# Patient Record
Sex: Female | Born: 1960 | Race: White | Hispanic: Yes | Marital: Married | Smoking: Never smoker
Health system: Southern US, Community
[De-identification: ages and names within clinical notes are randomized; demographics above are authoritative.]

## PROBLEM LIST (undated history)

## (undated) DIAGNOSIS — R002 Palpitations: Secondary | ICD-10-CM

## (undated) DIAGNOSIS — R202 Paresthesia of skin: Secondary | ICD-10-CM

## (undated) DIAGNOSIS — R7989 Other specified abnormal findings of blood chemistry: Secondary | ICD-10-CM

## (undated) HISTORY — DX: Other specified abnormal findings of blood chemistry: R79.89

## (undated) HISTORY — DX: Palpitations: R00.2

## (undated) HISTORY — DX: Paresthesia of skin: R20.2

---

## 2007-08-25 ENCOUNTER — Other Ambulatory Visit: Admission: RE | Admit: 2007-08-25 | Discharge: 2007-08-25 | Payer: Self-pay | Admitting: Obstetrics and Gynecology

## 2007-09-29 ENCOUNTER — Ambulatory Visit (HOSPITAL_COMMUNITY): Admission: RE | Admit: 2007-09-29 | Discharge: 2007-09-29 | Payer: Self-pay | Admitting: Obstetrics and Gynecology

## 2008-09-03 ENCOUNTER — Other Ambulatory Visit: Admission: RE | Admit: 2008-09-03 | Discharge: 2008-09-03 | Payer: Self-pay | Admitting: Obstetrics and Gynecology

## 2008-10-06 ENCOUNTER — Ambulatory Visit (HOSPITAL_COMMUNITY): Admission: RE | Admit: 2008-10-06 | Discharge: 2008-10-06 | Payer: Self-pay | Admitting: Obstetrics and Gynecology

## 2009-10-21 ENCOUNTER — Emergency Department (HOSPITAL_COMMUNITY)
Admission: EM | Admit: 2009-10-21 | Discharge: 2009-10-21 | Payer: Self-pay | Source: Home / Self Care | Admitting: Emergency Medicine

## 2009-10-25 ENCOUNTER — Encounter: Admission: RE | Admit: 2009-10-25 | Discharge: 2009-10-25 | Payer: Self-pay | Admitting: Family Medicine

## 2009-11-04 ENCOUNTER — Encounter: Admission: RE | Admit: 2009-11-04 | Discharge: 2009-11-04 | Payer: Self-pay | Admitting: Family Medicine

## 2010-04-10 ENCOUNTER — Encounter: Payer: Self-pay | Admitting: Obstetrics and Gynecology

## 2010-07-06 ENCOUNTER — Other Ambulatory Visit: Payer: Self-pay | Admitting: Neurosurgery

## 2010-07-06 DIAGNOSIS — M545 Low back pain: Secondary | ICD-10-CM

## 2010-07-07 ENCOUNTER — Ambulatory Visit
Admission: RE | Admit: 2010-07-07 | Discharge: 2010-07-07 | Disposition: A | Discharge status: Home / Self Care | Payer: Self-pay | Source: Ambulatory Visit | Attending: Neurosurgery | Admitting: Neurosurgery

## 2010-07-07 DIAGNOSIS — M545 Low back pain: Secondary | ICD-10-CM

## 2010-07-11 ENCOUNTER — Other Ambulatory Visit: Payer: Self-pay

## 2010-10-30 ENCOUNTER — Other Ambulatory Visit: Payer: Self-pay | Admitting: Family Medicine

## 2010-10-30 DIAGNOSIS — Z1231 Encounter for screening mammogram for malignant neoplasm of breast: Secondary | ICD-10-CM

## 2010-10-30 DIAGNOSIS — Z139 Encounter for screening, unspecified: Secondary | ICD-10-CM

## 2010-10-31 ENCOUNTER — Ambulatory Visit
Admission: RE | Admit: 2010-10-31 | Discharge: 2010-10-31 | Disposition: A | Discharge status: Home / Self Care | Payer: BC Managed Care – PPO | Source: Ambulatory Visit | Attending: Family Medicine | Admitting: Family Medicine

## 2010-10-31 ENCOUNTER — Other Ambulatory Visit: Payer: Self-pay | Admitting: Family Medicine

## 2010-10-31 DIAGNOSIS — Z139 Encounter for screening, unspecified: Secondary | ICD-10-CM

## 2010-10-31 DIAGNOSIS — Z1231 Encounter for screening mammogram for malignant neoplasm of breast: Secondary | ICD-10-CM
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
## 2011-11-06 ENCOUNTER — Other Ambulatory Visit: Payer: Self-pay | Admitting: Family Medicine

## 2011-11-06 DIAGNOSIS — Z1231 Encounter for screening mammogram for malignant neoplasm of breast: Secondary | ICD-10-CM

## 2011-11-18 LAB — HM MAMMOGRAPHY

## 2011-11-23 ENCOUNTER — Other Ambulatory Visit (HOSPITAL_COMMUNITY): Payer: Self-pay | Admitting: Obstetrics and Gynecology

## 2011-11-23 DIAGNOSIS — Z1231 Encounter for screening mammogram for malignant neoplasm of breast: Secondary | ICD-10-CM

## 2011-11-30 ENCOUNTER — Ambulatory Visit (HOSPITAL_COMMUNITY)
Admission: RE | Admit: 2011-11-30 | Discharge: 2011-11-30 | Disposition: A | Discharge status: Home / Self Care | Payer: BC Managed Care – PPO | Source: Ambulatory Visit | Attending: Obstetrics and Gynecology | Admitting: Obstetrics and Gynecology

## 2011-11-30 DIAGNOSIS — Z1231 Encounter for screening mammogram for malignant neoplasm of breast: Secondary | ICD-10-CM | POA: Insufficient documentation

## 2012-10-10 ENCOUNTER — Ambulatory Visit: Payer: Self-pay | Admitting: Obstetrics and Gynecology

## 2012-10-10 DIAGNOSIS — Z01419 Encounter for gynecological examination (general) (routine) without abnormal findings: Secondary | ICD-10-CM

## 2012-10-13 ENCOUNTER — Telehealth: Payer: Self-pay | Admitting: Obstetrics and Gynecology

## 2012-10-13 ENCOUNTER — Ambulatory Visit (INDEPENDENT_AMBULATORY_CARE_PROVIDER_SITE_OTHER): Payer: BC Managed Care – PPO | Admitting: Obstetrics and Gynecology

## 2012-10-13 ENCOUNTER — Encounter: Payer: Self-pay | Admitting: Obstetrics and Gynecology

## 2012-10-13 VITALS — BP 100/66 | HR 60 | Ht 62.0 in | Wt 97.5 lb

## 2012-10-13 DIAGNOSIS — Z0000 Encounter for general adult medical examination without abnormal findings: Secondary | ICD-10-CM

## 2012-10-13 DIAGNOSIS — Z01419 Encounter for gynecological examination (general) (routine) without abnormal findings: Secondary | ICD-10-CM

## 2012-10-13 DIAGNOSIS — R5381 Other malaise: Secondary | ICD-10-CM

## 2012-10-13 DIAGNOSIS — Z1211 Encounter for screening for malignant neoplasm of colon: Secondary | ICD-10-CM

## 2012-10-13 DIAGNOSIS — R5383 Other fatigue: Secondary | ICD-10-CM

## 2012-10-13 LAB — POCT URINALYSIS DIPSTICK
Blood, UA: NEGATIVE
Nitrite, UA: NEGATIVE
pH, UA: 5

## 2012-10-13 NOTE — Patient Instructions (Signed)
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
## 2012-10-13 NOTE — Telephone Encounter (Signed)
Patient missed her appointment on Friday . Called this morning to reschedule for this afternoon at 2:30pm.

## 2012-10-14 ENCOUNTER — Other Ambulatory Visit: Payer: BC Managed Care – PPO

## 2012-10-15 ENCOUNTER — Other Ambulatory Visit (INDEPENDENT_AMBULATORY_CARE_PROVIDER_SITE_OTHER): Payer: BC Managed Care – PPO

## 2012-10-15 DIAGNOSIS — Z01419 Encounter for gynecological examination (general) (routine) without abnormal findings: Secondary | ICD-10-CM

## 2012-10-15 DIAGNOSIS — Z0000 Encounter for general adult medical examination without abnormal findings: Secondary | ICD-10-CM

## 2012-10-15 DIAGNOSIS — R5381 Other malaise: Secondary | ICD-10-CM

## 2012-10-15 LAB — COMPREHENSIVE METABOLIC PANEL
Alkaline Phosphatase: 63 U/L (ref 39–117)
BUN: 10 mg/dL (ref 6–23)
CO2: 28 mEq/L (ref 19–32)
Glucose, Bld: 87 mg/dL (ref 70–99)
Total Bilirubin: 0.6 mg/dL (ref 0.3–1.2)
Total Protein: 6.9 g/dL (ref 6.0–8.3)

## 2012-10-15 LAB — CBC
HCT: 40.6 % (ref 36.0–46.0)
Hemoglobin: 13.7 g/dL (ref 12.0–15.0)
MCH: 29.2 pg (ref 26.0–34.0)
MCHC: 33.7 g/dL (ref 30.0–36.0)
MCV: 86.6 fL (ref 78.0–100.0)
Platelets: 264 10*3/uL (ref 150–400)
RBC: 4.69 MIL/uL (ref 3.87–5.11)
RDW: 13.6 % (ref 11.5–15.5)
WBC: 6.9 10*3/uL (ref 4.0–10.5)

## 2012-10-15 LAB — LIPID PANEL
HDL: 80 mg/dL (ref >39)
Triglycerides: 59 mg/dL (ref <150)

## 2012-10-15 LAB — IPS PAP TEST WITH HPV

## 2012-10-17 ENCOUNTER — Telehealth: Payer: Self-pay

## 2012-10-17 NOTE — Telephone Encounter (Signed)
LMOVM to call for test results. 

## 2012-10-17 NOTE — Telephone Encounter (Signed)
Message copied by Alphonsa Overall on Fri Oct 17, 2012 10:47 AM ------      Message from: Conley Simmonds      Created: Thu Oct 16, 2012  1:13 PM       Please report results to the patient.       Her total LDL cholesterol is slightly high, but her HDL is great!      Overall her ratios look good.            The remaining testing is all normal. ------

## 2012-10-23 NOTE — Telephone Encounter (Signed)
Patient notified of normal labs and lipid panel reviewed with patient.

## 2012-10-27 ENCOUNTER — Telehealth: Payer: Self-pay | Admitting: Orthopedic Surgery

## 2012-10-27 NOTE — Telephone Encounter (Signed)
Call to home/work number [phone]. No answer. Cannot leave message.   (Need to tell pt about appt with Dr. Loreta Ave 11-13-12 at 9 am for consult for colonoscopy. Phone [phone]. Address [address].)

## 2012-12-02 ENCOUNTER — Other Ambulatory Visit (HOSPITAL_COMMUNITY): Payer: Self-pay | Admitting: Family Medicine

## 2012-12-02 DIAGNOSIS — Z1231 Encounter for screening mammogram for malignant neoplasm of breast: Secondary | ICD-10-CM

## 2012-12-08 ENCOUNTER — Ambulatory Visit (HOSPITAL_COMMUNITY)
Admission: RE | Admit: 2012-12-08 | Discharge: 2012-12-08 | Disposition: A | Discharge status: Home / Self Care | Payer: BC Managed Care – PPO | Source: Ambulatory Visit | Attending: Family Medicine | Admitting: Family Medicine

## 2012-12-08 DIAGNOSIS — Z1231 Encounter for screening mammogram for malignant neoplasm of breast: Secondary | ICD-10-CM | POA: Insufficient documentation

## 2013-01-22 ENCOUNTER — Other Ambulatory Visit: Payer: Self-pay

## 2013-08-17 ENCOUNTER — Encounter: Payer: Self-pay | Admitting: Obstetrics and Gynecology

## 2013-10-15 ENCOUNTER — Ambulatory Visit: Payer: BC Managed Care – PPO | Admitting: Obstetrics and Gynecology

## 2013-10-19 ENCOUNTER — Encounter: Payer: Self-pay | Admitting: Obstetrics and Gynecology

## 2013-10-19 ENCOUNTER — Ambulatory Visit (INDEPENDENT_AMBULATORY_CARE_PROVIDER_SITE_OTHER): Payer: BC Managed Care – PPO | Admitting: Obstetrics and Gynecology

## 2013-10-19 VITALS — BP 98/70 | HR 66 | Resp 22 | Ht 62.0 in | Wt 96.4 lb

## 2013-10-19 DIAGNOSIS — Z0000 Encounter for general adult medical examination without abnormal findings: Secondary | ICD-10-CM

## 2013-10-19 DIAGNOSIS — Z01419 Encounter for gynecological examination (general) (routine) without abnormal findings: Secondary | ICD-10-CM

## 2013-10-19 LAB — POCT URINALYSIS DIPSTICK
BILIRUBIN UA: NEGATIVE
GLUCOSE UA: NEGATIVE
KETONES UA: NEGATIVE
LEUKOCYTES UA: NEGATIVE
Nitrite, UA: NEGATIVE
Protein, UA: NEGATIVE
RBC UA: NEGATIVE
Urobilinogen, UA: NEGATIVE
pH, UA: 5
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
## 2013-10-19 NOTE — Patient Instructions (Signed)

## 2013-10-21 ENCOUNTER — Other Ambulatory Visit (INDEPENDENT_AMBULATORY_CARE_PROVIDER_SITE_OTHER): Payer: BC Managed Care – PPO

## 2013-10-21 DIAGNOSIS — Z01419 Encounter for gynecological examination (general) (routine) without abnormal findings: Secondary | ICD-10-CM

## 2013-10-21 LAB — CBC
HEMATOCRIT: 37.8 % (ref 36.0–46.0)
Hemoglobin: 12.9 g/dL (ref 12.0–15.0)
MCH: 28.9 pg (ref 26.0–34.0)
MCHC: 34.1 g/dL (ref 30.0–36.0)
MCV: 84.8 fL (ref 78.0–100.0)
PLATELETS: 255 10*3/uL (ref 150–400)
RBC: 4.46 MIL/uL (ref 3.87–5.11)
RDW: 14.1 % (ref 11.5–15.5)
WBC: 6.6 10*3/uL (ref 4.0–10.5)

## 2013-10-22 LAB — COMPREHENSIVE METABOLIC PANEL
ALBUMIN: 4.3 g/dL (ref 3.5–5.2)
ALK PHOS: 68 U/L (ref 39–117)
ALT: 14 U/L (ref 0–35)
AST: 20 U/L (ref 0–37)
BUN: 12 mg/dL (ref 6–23)
CO2: 27 mEq/L (ref 19–32)
CREATININE: 0.75 mg/dL (ref 0.50–1.10)
Calcium: 9.7 mg/dL (ref 8.4–10.5)
Chloride: 106 mEq/L (ref 96–112)
Glucose, Bld: 86 mg/dL (ref 70–99)
POTASSIUM: 4.4 meq/L (ref 3.5–5.3)
Sodium: 140 mEq/L (ref 135–145)
Total Bilirubin: 0.5 mg/dL (ref 0.2–1.2)
Total Protein: 6.6 g/dL (ref 6.0–8.3)

## 2013-10-22 LAB — LIPID PANEL
CHOL/HDL RATIO: 2.7 ratio
Cholesterol: 208 mg/dL — ABNORMAL HIGH (ref 0–200)
HDL: 76 mg/dL (ref >39)
LDL Cholesterol: 121 mg/dL — ABNORMAL HIGH (ref 0–99)
Triglycerides: 55 mg/dL (ref <150)
VLDL: 11 mg/dL (ref 0–40)

## 2013-10-22 LAB — VITAMIN D 25 HYDROXY (VIT D DEFICIENCY, FRACTURES): VIT D 25 HYDROXY: 33 ng/mL (ref 30–89)

## 2013-12-15 ENCOUNTER — Other Ambulatory Visit (HOSPITAL_COMMUNITY): Payer: Self-pay | Admitting: Family Medicine

## 2013-12-15 ENCOUNTER — Other Ambulatory Visit: Payer: Self-pay

## 2013-12-15 DIAGNOSIS — Z1231 Encounter for screening mammogram for malignant neoplasm of breast: Secondary | ICD-10-CM

## 2013-12-16 ENCOUNTER — Ambulatory Visit (HOSPITAL_COMMUNITY)
Admission: RE | Admit: 2013-12-16 | Discharge: 2013-12-16 | Disposition: A | Discharge status: Home / Self Care | Payer: BC Managed Care – PPO | Source: Ambulatory Visit | Attending: Family Medicine | Admitting: Family Medicine

## 2013-12-16 DIAGNOSIS — Z1231 Encounter for screening mammogram for malignant neoplasm of breast: Secondary | ICD-10-CM | POA: Insufficient documentation

## 2014-01-18 ENCOUNTER — Encounter: Payer: Self-pay | Admitting: Obstetrics and Gynecology

## 2014-10-22 ENCOUNTER — Encounter: Payer: Self-pay | Admitting: Obstetrics and Gynecology

## 2014-10-22 ENCOUNTER — Ambulatory Visit (INDEPENDENT_AMBULATORY_CARE_PROVIDER_SITE_OTHER): Payer: BC Managed Care – PPO | Admitting: Obstetrics and Gynecology

## 2014-10-22 VITALS — BP 102/70 | HR 68 | Resp 16 | Ht 62.0 in | Wt 96.0 lb

## 2014-10-22 DIAGNOSIS — Z0000 Encounter for general adult medical examination without abnormal findings: Secondary | ICD-10-CM | POA: Diagnosis not present

## 2014-10-22 DIAGNOSIS — Z01419 Encounter for gynecological examination (general) (routine) without abnormal findings: Secondary | ICD-10-CM

## 2014-10-22 DIAGNOSIS — Z1211 Encounter for screening for malignant neoplasm of colon: Secondary | ICD-10-CM

## 2014-10-22 LAB — POCT URINALYSIS DIPSTICK
BILIRUBIN UA: NEGATIVE
Blood, UA: NEGATIVE
GLUCOSE UA: NEGATIVE
KETONES UA: NEGATIVE
Leukocytes, UA: NEGATIVE
Nitrite, UA: NEGATIVE
PH UA: 6
Protein, UA: NEGATIVE
Urobilinogen, UA: NEGATIVE

## 2014-10-22 NOTE — Patient Instructions (Signed)
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
## 2014-10-25 ENCOUNTER — Other Ambulatory Visit: Payer: BC Managed Care – PPO

## 2014-10-26 ENCOUNTER — Other Ambulatory Visit (INDEPENDENT_AMBULATORY_CARE_PROVIDER_SITE_OTHER): Payer: BC Managed Care – PPO

## 2014-10-26 DIAGNOSIS — Z01419 Encounter for gynecological examination (general) (routine) without abnormal findings: Secondary | ICD-10-CM

## 2014-10-26 LAB — CBC
HEMATOCRIT: 39.7 % (ref 36.0–46.0)
Hemoglobin: 13.4 g/dL (ref 12.0–15.0)
MCH: 29.3 pg (ref 26.0–34.0)
MCHC: 33.8 g/dL (ref 30.0–36.0)
MCV: 86.9 fL (ref 78.0–100.0)
MPV: 11 fL (ref 8.6–12.4)
Platelets: 225 10*3/uL (ref 150–400)
RBC: 4.57 MIL/uL (ref 3.87–5.11)
RDW: 13.5 % (ref 11.5–15.5)
WBC: 6.9 10*3/uL (ref 4.0–10.5)

## 2014-10-26 LAB — TSH: TSH: 2.847 u[IU]/mL (ref 0.350–4.500)

## 2014-10-26 LAB — COMPREHENSIVE METABOLIC PANEL
ALT: 8 U/L (ref 6–29)
AST: 18 U/L (ref 10–35)
Albumin: 4.1 g/dL (ref 3.6–5.1)
Alkaline Phosphatase: 53 U/L (ref 33–130)
BUN: 12 mg/dL (ref 7–25)
CO2: 29 mmol/L (ref 20–31)
CREATININE: 0.68 mg/dL (ref 0.50–1.05)
Calcium: 9.4 mg/dL (ref 8.6–10.4)
Chloride: 105 mmol/L (ref 98–110)
Glucose, Bld: 88 mg/dL (ref 65–99)
Potassium: 5 mmol/L (ref 3.5–5.3)
SODIUM: 140 mmol/L (ref 135–146)
Total Bilirubin: 0.4 mg/dL (ref 0.2–1.2)
Total Protein: 6.5 g/dL (ref 6.1–8.1)

## 2014-10-26 LAB — LIPID PANEL
Cholesterol: 215 mg/dL — ABNORMAL HIGH (ref 125–200)
HDL: 84 mg/dL (ref >=46)
LDL CALC: 116 mg/dL (ref <130)
Total CHOL/HDL Ratio: 2.6 Ratio (ref <=5.0)
Triglycerides: 74 mg/dL (ref <150)
VLDL: 15 mg/dL (ref <30)

## 2014-10-27 ENCOUNTER — Other Ambulatory Visit: Payer: BC Managed Care – PPO

## 2014-11-23 ENCOUNTER — Other Ambulatory Visit: Payer: Self-pay

## 2014-11-23 DIAGNOSIS — Z1231 Encounter for screening mammogram for malignant neoplasm of breast: Secondary | ICD-10-CM

## 2014-12-21 ENCOUNTER — Ambulatory Visit: Payer: BC Managed Care – PPO

## 2014-12-23 ENCOUNTER — Ambulatory Visit
Admission: RE | Admit: 2014-12-23 | Discharge: 2014-12-23 | Disposition: A | Discharge status: Home / Self Care | Payer: BC Managed Care – PPO | Source: Ambulatory Visit

## 2014-12-23 DIAGNOSIS — Z1231 Encounter for screening mammogram for malignant neoplasm of breast: Secondary | ICD-10-CM

## 2015-06-06 ENCOUNTER — Ambulatory Visit (INDEPENDENT_AMBULATORY_CARE_PROVIDER_SITE_OTHER): Payer: BC Managed Care – PPO | Admitting: Neurology

## 2015-06-06 ENCOUNTER — Encounter: Payer: Self-pay | Admitting: Neurology

## 2015-06-06 ENCOUNTER — Encounter: Payer: Self-pay | Admitting: *Deleted

## 2015-06-06 ENCOUNTER — Other Ambulatory Visit (INDEPENDENT_AMBULATORY_CARE_PROVIDER_SITE_OTHER): Payer: BC Managed Care – PPO

## 2015-06-06 VITALS — BP 110/70 | HR 59 | Ht 62.0 in | Wt 101.5 lb

## 2015-06-06 DIAGNOSIS — M546 Pain in thoracic spine: Secondary | ICD-10-CM | POA: Diagnosis not present

## 2015-06-06 DIAGNOSIS — R202 Paresthesia of skin: Secondary | ICD-10-CM

## 2015-06-06 LAB — TSH: TSH: 2.5 u[IU]/mL (ref 0.35–4.50)

## 2015-06-06 LAB — VITAMIN B12: Vitamin B-12: 753 pg/mL (ref 211–911)

## 2015-06-06 MED ORDER — GABAPENTIN 100 MG PO CAPS: 100.0000 mg | ORAL_CAPSULE | Freq: Every day | ORAL | Status: DC

## 2015-06-06 NOTE — Patient Instructions (Addendum)
1.  Start gabapentin 100mg  at bedtime, if no improvement increase to 2 tablets at bedtime in two weeks 2.  Check blood work 3.  Call with update in 1 month

## 2015-06-06 NOTE — Progress Notes (Signed)
Note routed
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
## 2015-10-24 ENCOUNTER — Other Ambulatory Visit: Payer: Self-pay | Admitting: Obstetrics and Gynecology

## 2015-10-24 DIAGNOSIS — Z1231 Encounter for screening mammogram for malignant neoplasm of breast: Secondary | ICD-10-CM

## 2015-10-27 ENCOUNTER — Ambulatory Visit: Payer: BC Managed Care – PPO | Admitting: Obstetrics and Gynecology

## 2015-10-31 ENCOUNTER — Ambulatory Visit (INDEPENDENT_AMBULATORY_CARE_PROVIDER_SITE_OTHER): Payer: BC Managed Care – PPO | Admitting: Neurology

## 2015-10-31 ENCOUNTER — Encounter: Payer: Self-pay | Admitting: Neurology

## 2015-10-31 ENCOUNTER — Telehealth: Payer: Self-pay | Admitting: Neurology

## 2015-10-31 DIAGNOSIS — R208 Other disturbances of skin sensation: Secondary | ICD-10-CM

## 2015-10-31 DIAGNOSIS — M5432 Sciatica, left side: Secondary | ICD-10-CM | POA: Diagnosis not present

## 2015-10-31 DIAGNOSIS — G5702 Lesion of sciatic nerve, left lower limb: Secondary | ICD-10-CM

## 2015-10-31 DIAGNOSIS — M5412 Radiculopathy, cervical region: Secondary | ICD-10-CM | POA: Diagnosis not present

## 2015-10-31 DIAGNOSIS — M549 Dorsalgia, unspecified: Secondary | ICD-10-CM | POA: Diagnosis not present

## 2015-10-31 DIAGNOSIS — R200 Anesthesia of skin: Secondary | ICD-10-CM | POA: Insufficient documentation

## 2015-10-31 MED ORDER — ETODOLAC 400 MG PO TABS: 400.0000 mg | ORAL_TABLET | Freq: Two times a day (BID) | ORAL | 3 refills | Status: DC

## 2015-10-31 MED ORDER — CYCLOBENZAPRINE HCL 5 MG PO TABS: ORAL_TABLET | ORAL | 3 refills | Status: DC

## 2015-10-31 NOTE — Patient Instructions (Signed)
The pain in the buttock is likely coming from piriformis muscle spasms. Look on [email or web address] to fine people doing piriformis stretch exercises.
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
## 2015-10-31 NOTE — Telephone Encounter (Signed)
Noted/fim 

## 2015-10-31 NOTE — Telephone Encounter (Signed)
FYI-Pt never stopped to check-out and I noticed there is a 6 week f/u.  Called and LVM for pt to schedule.

## 2015-11-02 ENCOUNTER — Ambulatory Visit (INDEPENDENT_AMBULATORY_CARE_PROVIDER_SITE_OTHER): Payer: BC Managed Care – PPO | Admitting: Obstetrics and Gynecology

## 2015-11-02 ENCOUNTER — Telehealth: Payer: Self-pay | Admitting: Neurology

## 2015-11-02 ENCOUNTER — Encounter: Payer: Self-pay | Admitting: Obstetrics and Gynecology

## 2015-11-02 VITALS — BP 110/60 | HR 64 | Resp 22 | Ht 62.0 in | Wt 96.2 lb

## 2015-11-02 DIAGNOSIS — R1032 Left lower quadrant pain: Secondary | ICD-10-CM

## 2015-11-02 DIAGNOSIS — Z119 Encounter for screening for infectious and parasitic diseases, unspecified: Secondary | ICD-10-CM

## 2015-11-02 DIAGNOSIS — Z01419 Encounter for gynecological examination (general) (routine) without abnormal findings: Secondary | ICD-10-CM

## 2015-11-02 DIAGNOSIS — Z0000 Encounter for general adult medical examination without abnormal findings: Secondary | ICD-10-CM

## 2015-11-02 LAB — POCT URINALYSIS DIPSTICK
Bilirubin, UA: NEGATIVE
Blood, UA: NEGATIVE
GLUCOSE UA: NEGATIVE
Ketones, UA: NEGATIVE
LEUKOCYTES UA: NEGATIVE
NITRITE UA: NEGATIVE
PROTEIN UA: NEGATIVE
UROBILINOGEN UA: NEGATIVE
pH, UA: 5
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
## 2015-11-02 NOTE — Telephone Encounter (Signed)
Pt called request MRi to be scheduled soon as she is in severe pain.  Pt also request Faith to call about getting the MRI scheduled soon

## 2015-11-02 NOTE — Telephone Encounter (Signed)
LMOM that once insurance has approved MRI, will get it sched. asap.  Some of this will depend on the imaging facility's schedule/fim

## 2015-11-02 NOTE — Patient Instructions (Signed)

## 2015-11-03 ENCOUNTER — Ambulatory Visit (INDEPENDENT_AMBULATORY_CARE_PROVIDER_SITE_OTHER): Payer: BC Managed Care – PPO | Admitting: Obstetrics and Gynecology

## 2015-11-03 ENCOUNTER — Encounter: Payer: Self-pay | Admitting: Obstetrics and Gynecology

## 2015-11-03 ENCOUNTER — Ambulatory Visit (INDEPENDENT_AMBULATORY_CARE_PROVIDER_SITE_OTHER): Payer: BC Managed Care – PPO

## 2015-11-03 VITALS — BP 108/70 | HR 60 | Ht 62.0 in | Wt 96.0 lb

## 2015-11-03 DIAGNOSIS — D251 Intramural leiomyoma of uterus: Secondary | ICD-10-CM | POA: Diagnosis not present

## 2015-11-03 DIAGNOSIS — R1032 Left lower quadrant pain: Secondary | ICD-10-CM

## 2015-11-03 DIAGNOSIS — Z0000 Encounter for general adult medical examination without abnormal findings: Secondary | ICD-10-CM

## 2015-11-03 DIAGNOSIS — Z119 Encounter for screening for infectious and parasitic diseases, unspecified: Secondary | ICD-10-CM

## 2015-11-03 LAB — CBC
HEMATOCRIT: 42.2 % (ref 35.0–45.0)
Hemoglobin: 13.8 g/dL (ref 11.7–15.5)
MCH: 29.2 pg (ref 27.0–33.0)
MCHC: 32.7 g/dL (ref 32.0–36.0)
MCV: 89.4 fL (ref 80.0–100.0)
MPV: 10.6 fL (ref 7.5–12.5)
PLATELETS: 250 10*3/uL (ref 140–400)
RBC: 4.72 MIL/uL (ref 3.80–5.10)
RDW: 13.7 % (ref 11.0–15.0)
WBC: 7.1 10*3/uL (ref 3.8–10.8)

## 2015-11-03 LAB — LIPID PANEL
Cholesterol: 231 mg/dL — ABNORMAL HIGH (ref 125–200)
HDL: 92 mg/dL (ref >=46)
LDL CALC: 127 mg/dL (ref <130)
Total CHOL/HDL Ratio: 2.5 Ratio (ref <=5.0)
Triglycerides: 58 mg/dL (ref <150)
VLDL: 12 mg/dL (ref <30)

## 2015-11-03 LAB — COMPREHENSIVE METABOLIC PANEL
ALK PHOS: 59 U/L (ref 33–130)
ALT: 12 U/L (ref 6–29)
AST: 20 U/L (ref 10–35)
Albumin: 4.4 g/dL (ref 3.6–5.1)
BUN: 15 mg/dL (ref 7–25)
CALCIUM: 9.8 mg/dL (ref 8.6–10.4)
CHLORIDE: 105 mmol/L (ref 98–110)
CO2: 23 mmol/L (ref 20–31)
Creat: 0.8 mg/dL (ref 0.50–1.05)
GLUCOSE: 89 mg/dL (ref 65–99)
POTASSIUM: 4.9 mmol/L (ref 3.5–5.3)
Sodium: 140 mmol/L (ref 135–146)
Total Bilirubin: 0.5 mg/dL (ref 0.2–1.2)
Total Protein: 6.9 g/dL (ref 6.1–8.1)

## 2015-11-03 LAB — HEPATITIS C ANTIBODY: HCV Ab: NEGATIVE

## 2015-11-03 NOTE — Patient Instructions (Signed)
Please call for any vaginal bleeding or increased pain.
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
## 2015-11-04 LAB — VITAMIN D 25 HYDROXY (VIT D DEFICIENCY, FRACTURES): Vit D, 25-Hydroxy: 28 ng/mL — ABNORMAL LOW (ref 30–100)

## 2015-11-08 LAB — IPS PAP TEST WITH HPV

## 2015-11-11 NOTE — Telephone Encounter (Signed)
Called patient to schedule MRI and she stated that she would call me back at a later time to let me know if she wanted to do it here.

## 2015-11-16 ENCOUNTER — Other Ambulatory Visit: Payer: BC Managed Care – PPO

## 2015-11-25 ENCOUNTER — Inpatient Hospital Stay: Admission: RE | Admit: 2015-11-25 | Payer: BC Managed Care – PPO | Source: Ambulatory Visit

## 2015-11-25 ENCOUNTER — Other Ambulatory Visit: Payer: BC Managed Care – PPO

## 2015-12-14 ENCOUNTER — Other Ambulatory Visit: Payer: Self-pay | Admitting: Neurological Surgery

## 2015-12-14 DIAGNOSIS — G959 Disease of spinal cord, unspecified: Secondary | ICD-10-CM

## 2015-12-14 DIAGNOSIS — M4316 Spondylolisthesis, lumbar region: Secondary | ICD-10-CM

## 2015-12-27 ENCOUNTER — Ambulatory Visit: Payer: BC Managed Care – PPO

## 2015-12-28 ENCOUNTER — Ambulatory Visit
Admission: RE | Admit: 2015-12-28 | Discharge: 2015-12-28 | Disposition: A | Discharge status: Home / Self Care | Payer: BC Managed Care – PPO | Source: Ambulatory Visit | Attending: Obstetrics and Gynecology | Admitting: Obstetrics and Gynecology

## 2015-12-28 DIAGNOSIS — Z1231 Encounter for screening mammogram for malignant neoplasm of breast: Secondary | ICD-10-CM

## 2016-02-13 ENCOUNTER — Emergency Department (HOSPITAL_COMMUNITY)
Admission: EM | Admit: 2016-02-13 | Discharge: 2016-02-13 | Disposition: A | Discharge status: Home / Self Care | Payer: BC Managed Care – PPO | Attending: Emergency Medicine | Admitting: Emergency Medicine

## 2016-02-13 ENCOUNTER — Emergency Department (HOSPITAL_COMMUNITY): Payer: BC Managed Care – PPO

## 2016-02-13 DIAGNOSIS — Y939 Activity, unspecified: Secondary | ICD-10-CM | POA: Diagnosis not present

## 2016-02-13 DIAGNOSIS — Z79899 Other long term (current) drug therapy: Secondary | ICD-10-CM | POA: Diagnosis not present

## 2016-02-13 DIAGNOSIS — M25561 Pain in right knee: Secondary | ICD-10-CM | POA: Insufficient documentation

## 2016-02-13 DIAGNOSIS — Y999 Unspecified external cause status: Secondary | ICD-10-CM | POA: Diagnosis not present

## 2016-02-13 DIAGNOSIS — R1032 Left lower quadrant pain: Secondary | ICD-10-CM | POA: Diagnosis present

## 2016-02-13 DIAGNOSIS — Y92410 Unspecified street and highway as the place of occurrence of the external cause: Secondary | ICD-10-CM | POA: Insufficient documentation

## 2016-02-13 DIAGNOSIS — M546 Pain in thoracic spine: Secondary | ICD-10-CM | POA: Insufficient documentation

## 2016-02-13 DIAGNOSIS — M545 Low back pain: Secondary | ICD-10-CM | POA: Diagnosis not present

## 2016-02-13 DIAGNOSIS — M25562 Pain in left knee: Secondary | ICD-10-CM | POA: Diagnosis not present

## 2016-02-13 DIAGNOSIS — M79602 Pain in left arm: Secondary | ICD-10-CM | POA: Insufficient documentation

## 2016-02-13 MED ORDER — IBUPROFEN 200 MG PO TABS: 200.0000 mg | ORAL_TABLET | Freq: Four times a day (QID) | ORAL | 0 refills | Status: DC | PRN

## 2016-02-13 MED ORDER — METHOCARBAMOL 500 MG PO TABS: 500.0000 mg | ORAL_TABLET | Freq: Two times a day (BID) | ORAL | 0 refills | Status: DC

## 2016-02-13 MED ORDER — IBUPROFEN 800 MG PO TABS
800.0000 mg | ORAL_TABLET | Freq: Once | ORAL | Status: AC
  Administered 2016-02-13: 800 mg via ORAL
  Filled 2016-02-13: qty 1
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
## 2016-02-13 NOTE — ED Notes (Signed)
Pt verbalized understanding of discharge instructions and denies any further questions at this time.   

## 2016-02-13 NOTE — ED Notes (Signed)
ED Provider at bedside. 
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
## 2016-02-13 NOTE — ED Notes (Signed)
Patient transported to X-ray 

## 2016-11-02 ENCOUNTER — Other Ambulatory Visit: Payer: Self-pay | Admitting: Family Medicine

## 2016-11-02 ENCOUNTER — Other Ambulatory Visit: Payer: Self-pay | Admitting: Obstetrics and Gynecology

## 2016-11-02 DIAGNOSIS — Z1231 Encounter for screening mammogram for malignant neoplasm of breast: Secondary | ICD-10-CM

## 2016-11-07 ENCOUNTER — Encounter: Payer: Self-pay | Admitting: Obstetrics and Gynecology

## 2016-11-07 ENCOUNTER — Ambulatory Visit: Payer: BC Managed Care – PPO | Admitting: Obstetrics and Gynecology

## 2016-11-07 VITALS — BP 100/66 | HR 60 | Resp 26 | Ht 61.5 in | Wt 99.0 lb

## 2016-11-07 DIAGNOSIS — Z01419 Encounter for gynecological examination (general) (routine) without abnormal findings: Secondary | ICD-10-CM
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
## 2016-11-07 NOTE — Patient Instructions (Signed)

## 2016-11-08 ENCOUNTER — Other Ambulatory Visit (INDEPENDENT_AMBULATORY_CARE_PROVIDER_SITE_OTHER): Payer: BC Managed Care – PPO

## 2016-11-08 DIAGNOSIS — Z0000 Encounter for general adult medical examination without abnormal findings: Secondary | ICD-10-CM

## 2016-11-08 DIAGNOSIS — Z01419 Encounter for gynecological examination (general) (routine) without abnormal findings: Secondary | ICD-10-CM

## 2016-11-09 LAB — LIPID PANEL
CHOL/HDL RATIO: 2.8 ratio (ref 0.0–4.4)
CHOLESTEROL TOTAL: 243 mg/dL — AB (ref 100–199)
HDL: 88 mg/dL (ref >39)
LDL CALC: 144 mg/dL — AB (ref 0–99)
Triglycerides: 56 mg/dL (ref 0–149)
VLDL CHOLESTEROL CAL: 11 mg/dL (ref 5–40)

## 2016-11-09 LAB — COMPREHENSIVE METABOLIC PANEL
ALK PHOS: 79 IU/L (ref 39–117)
ALT: 11 IU/L (ref 0–32)
AST: 19 IU/L (ref 0–40)
Albumin/Globulin Ratio: 1.9 (ref 1.2–2.2)
Albumin: 4.7 g/dL (ref 3.5–5.5)
BUN/Creatinine Ratio: 21 (ref 9–23)
BUN: 17 mg/dL (ref 6–24)
Bilirubin Total: 0.6 mg/dL (ref 0.0–1.2)
CO2: 23 mmol/L (ref 20–29)
CREATININE: 0.82 mg/dL (ref 0.57–1.00)
Calcium: 10.2 mg/dL (ref 8.7–10.2)
Chloride: 102 mmol/L (ref 96–106)
GFR calc Af Amer: 91 mL/min/{1.73_m2} (ref >59)
GFR calc non Af Amer: 79 mL/min/{1.73_m2} (ref >59)
GLOBULIN, TOTAL: 2.5 g/dL (ref 1.5–4.5)
Glucose: 78 mg/dL (ref 65–99)
Potassium: 5.1 mmol/L (ref 3.5–5.2)
SODIUM: 141 mmol/L (ref 134–144)
Total Protein: 7.2 g/dL (ref 6.0–8.5)

## 2016-11-09 LAB — CBC
HEMATOCRIT: 41.5 % (ref 34.0–46.6)
Hemoglobin: 14.2 g/dL (ref 11.1–15.9)
MCH: 29.6 pg (ref 26.6–33.0)
MCHC: 34.2 g/dL (ref 31.5–35.7)
MCV: 87 fL (ref 79–97)
Platelets: 248 10*3/uL (ref 150–379)
RBC: 4.8 x10E6/uL (ref 3.77–5.28)
RDW: 12.8 % (ref 12.3–15.4)
WBC: 7.6 10*3/uL (ref 3.4–10.8)

## 2016-11-09 LAB — TSH: TSH: 2.16 u[IU]/mL (ref 0.450–4.500)

## 2016-11-09 LAB — VITAMIN D 25 HYDROXY (VIT D DEFICIENCY, FRACTURES): VIT D 25 HYDROXY: 25.9 ng/mL — AB (ref 30.0–100.0)

## 2016-11-11 ENCOUNTER — Encounter: Payer: Self-pay | Admitting: Obstetrics and Gynecology

## 2016-11-22 ENCOUNTER — Telehealth: Payer: Self-pay

## 2016-11-22 NOTE — Telephone Encounter (Signed)
Called patient at [phone] to discuss lab results, LMOVM to call me.

## 2016-11-22 NOTE — Telephone Encounter (Signed)
-----   Message from Nunzio Cobbs, MD sent at 11/11/2016  1:00 PM EDT ----- Results to patient through My Chart.  Hello Ms Callegari,   I am sharing your blood work with you.   Your cholesterol is elevated due to both elevated good (HDL) and bad (LDL) cholesterol. The ratios of cholesterol are good, but the bad cholesterol is climbing.  I recommend increasing vigorous exercise, lowering cholesterol in your diet, and following up with your primary care provider.  Your vitamin D level is still low.   When I reviewed your chart, I did not see any vitamin supplements.  Vitamin D3 2000 IU daily should increase this into a normal range.   Your blood counts, blood chemistries, and thyroid function are normal.  Please contact the office for any questions.   Josefa Half, MD

## 2016-11-22 NOTE — Telephone Encounter (Signed)
Patient returned call to Amanda. °

## 2016-11-23 NOTE — Telephone Encounter (Signed)
Returned call to patient and per DPR left message stating she doesn't need to return my call unless she has a question about her lab results. I can see she finally reviewed results on MyChart.

## 2016-12-28 ENCOUNTER — Ambulatory Visit
Admission: RE | Admit: 2016-12-28 | Discharge: 2016-12-28 | Disposition: A | Discharge status: Home / Self Care | Payer: BC Managed Care – PPO | Source: Ambulatory Visit | Attending: Family Medicine | Admitting: Family Medicine

## 2016-12-28 DIAGNOSIS — Z1231 Encounter for screening mammogram for malignant neoplasm of breast: Secondary | ICD-10-CM
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
## 2017-11-20 ENCOUNTER — Encounter: Payer: Self-pay | Admitting: Obstetrics and Gynecology

## 2017-11-20 ENCOUNTER — Other Ambulatory Visit: Payer: Self-pay

## 2017-11-20 ENCOUNTER — Ambulatory Visit: Payer: BC Managed Care – PPO | Admitting: Obstetrics and Gynecology

## 2017-11-20 VITALS — BP 108/60 | HR 68 | Resp 16 | Ht 62.25 in | Wt 98.0 lb

## 2017-11-20 DIAGNOSIS — R102 Pelvic and perineal pain: Secondary | ICD-10-CM

## 2017-11-20 DIAGNOSIS — Z01419 Encounter for gynecological examination (general) (routine) without abnormal findings: Secondary | ICD-10-CM | POA: Diagnosis not present

## 2017-11-20 LAB — POCT URINALYSIS DIPSTICK
Bilirubin, UA: NEGATIVE
Blood, UA: NEGATIVE
Glucose, UA: NEGATIVE
KETONES UA: NEGATIVE
Leukocytes, UA: NEGATIVE
NITRITE UA: NEGATIVE
PH UA: 5 (ref 5.0–8.0)
PROTEIN UA: NEGATIVE
UROBILINOGEN UA: 0.2 U/dL

## 2017-11-20 NOTE — Patient Instructions (Signed)

## 2017-11-20 NOTE — Addendum Note (Signed)
Addended by: Terence Lux A on: 11/20/2017 01:14 PM   Modules accepted: Orders

## 2017-11-22 ENCOUNTER — Other Ambulatory Visit: Payer: Self-pay | Admitting: Family Medicine

## 2017-11-22 DIAGNOSIS — Z1231 Encounter for screening mammogram for malignant neoplasm of breast: Secondary | ICD-10-CM

## 2017-12-30 ENCOUNTER — Ambulatory Visit
Admission: RE | Admit: 2017-12-30 | Discharge: 2017-12-30 | Disposition: A | Discharge status: Home / Self Care | Payer: BC Managed Care – PPO | Source: Ambulatory Visit | Attending: Family Medicine | Admitting: Family Medicine

## 2017-12-30 DIAGNOSIS — Z1231 Encounter for screening mammogram for malignant neoplasm of breast: Secondary | ICD-10-CM

## 2018-11-21 ENCOUNTER — Other Ambulatory Visit: Payer: Self-pay
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
## 2018-11-26 ENCOUNTER — Other Ambulatory Visit: Payer: Self-pay

## 2018-11-26 ENCOUNTER — Encounter: Payer: Self-pay | Admitting: Obstetrics and Gynecology

## 2018-11-26 ENCOUNTER — Ambulatory Visit: Payer: BC Managed Care – PPO | Admitting: Obstetrics and Gynecology

## 2018-11-26 VITALS — BP 104/70 | HR 60 | Temp 97.3°F | Resp 16 | Ht 61.75 in | Wt 96.2 lb

## 2018-11-26 DIAGNOSIS — Z01419 Encounter for gynecological examination (general) (routine) without abnormal findings: Secondary | ICD-10-CM | POA: Diagnosis not present

## 2018-11-26 NOTE — Patient Instructions (Signed)

## 2019-05-16 ENCOUNTER — Ambulatory Visit: Payer: BC Managed Care – PPO | Attending: Internal Medicine

## 2019-05-16 DIAGNOSIS — Z23 Encounter for immunization: Secondary | ICD-10-CM | POA: Insufficient documentation

## 2019-05-16 NOTE — Progress Notes (Signed)
   Covid-19 Vaccination Clinic  Name:  Veronica Mcdowell    MRN: [id number] DOB: 21-Jul-1960  05/16/2019  Veronica Mcdowell was observed post Covid-19 immunization for 15 minutes without incidence. She was provided with Vaccine Information Sheet and instruction to access the V-Safe system.   Veronica Mcdowell was instructed to call 911 with any severe reactions post vaccine: Marland Kitchen Difficulty breathing  . Swelling of your face and throat  . A fast heartbeat  . A bad rash all over your body  . Dizziness and weakness    Immunizations Administered    Name Date Dose VIS Date Route   Pfizer COVID-19 Vaccine 05/16/2019  4:06 PM 0.3 mL 02/27/2019 Intramuscular   Manufacturer: [place]   Lot: [id number]   [place]: [id number]

## 2019-06-06 ENCOUNTER — Ambulatory Visit: Payer: BC Managed Care – PPO | Attending: Internal Medicine

## 2019-06-06 DIAGNOSIS — Z23 Encounter for immunization: Secondary | ICD-10-CM

## 2019-06-06 NOTE — Progress Notes (Signed)
   Covid-19 Vaccination Clinic  Name:  Veronica Mcdowell    MRN: [id number] DOB: Nov 28, 1960  06/06/2019  Veronica Mcdowell was observed post Covid-19 immunization for 15 minutes without incident. She was provided with Vaccine Information Sheet and instruction to access the V-Safe system.   Veronica Mcdowell was instructed to call 911 with any severe reactions post vaccine: Marland Kitchen Difficulty breathing  . Swelling of face and throat  . A fast heartbeat  . A bad rash all over body  . Dizziness and weakness   Immunizations Administered    Name Date Dose VIS Date Route   Pfizer COVID-19 Vaccine 06/06/2019  3:18 PM 0.3 mL 02/27/2019 Intramuscular   Manufacturer: [place]   Lot: [id number]   [place]: [id number]
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
## 2019-12-02 ENCOUNTER — Ambulatory Visit (INDEPENDENT_AMBULATORY_CARE_PROVIDER_SITE_OTHER): Payer: BC Managed Care – PPO | Admitting: Obstetrics and Gynecology

## 2019-12-02 ENCOUNTER — Encounter: Payer: Self-pay | Admitting: Obstetrics and Gynecology

## 2019-12-02 ENCOUNTER — Other Ambulatory Visit: Payer: Self-pay

## 2019-12-02 ENCOUNTER — Ambulatory Visit: Payer: BC Managed Care – PPO | Admitting: Obstetrics and Gynecology

## 2019-12-02 VITALS — BP 108/64 | HR 64 | Resp 12 | Ht 62.0 in | Wt 94.2 lb

## 2019-12-02 DIAGNOSIS — Z01419 Encounter for gynecological examination (general) (routine) without abnormal findings: Secondary | ICD-10-CM
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
## 2019-12-02 NOTE — Patient Instructions (Signed)

## 2019-12-03 ENCOUNTER — Other Ambulatory Visit: Payer: Self-pay

## 2019-12-03 ENCOUNTER — Other Ambulatory Visit: Payer: Self-pay | Admitting: Family Medicine

## 2019-12-03 DIAGNOSIS — Z1231 Encounter for screening mammogram for malignant neoplasm of breast: Secondary | ICD-10-CM

## 2019-12-17 ENCOUNTER — Ambulatory Visit
Admission: RE | Admit: 2019-12-17 | Discharge: 2019-12-17 | Disposition: A | Discharge status: Home / Self Care | Payer: BC Managed Care – PPO | Source: Ambulatory Visit | Attending: Family Medicine | Admitting: Family Medicine

## 2019-12-17 ENCOUNTER — Other Ambulatory Visit: Payer: Self-pay

## 2019-12-17 DIAGNOSIS — Z1231 Encounter for screening mammogram for malignant neoplasm of breast: Secondary | ICD-10-CM

## 2020-08-17 ENCOUNTER — Encounter: Payer: Self-pay | Admitting: Internal Medicine

## 2020-10-05 ENCOUNTER — Ambulatory Visit: Payer: BC Managed Care – PPO | Admitting: Neurology

## 2020-10-14 ENCOUNTER — Ambulatory Visit: Payer: BC Managed Care – PPO | Admitting: Internal Medicine

## 2020-10-20 ENCOUNTER — Ambulatory Visit: Payer: BC Managed Care – PPO | Admitting: Internal Medicine

## 2020-12-26 ENCOUNTER — Encounter: Payer: Self-pay | Admitting: Internal Medicine

## 2020-12-26 ENCOUNTER — Other Ambulatory Visit: Payer: Self-pay

## 2020-12-26 ENCOUNTER — Ambulatory Visit: Payer: BC Managed Care – PPO

## 2020-12-26 ENCOUNTER — Ambulatory Visit: Payer: BC Managed Care – PPO | Admitting: Internal Medicine

## 2020-12-26 VITALS — BP 110/80 | HR 69 | Ht 65.0 in | Wt 98.0 lb

## 2020-12-26 DIAGNOSIS — R002 Palpitations: Secondary | ICD-10-CM
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
## 2020-12-26 NOTE — Patient Instructions (Signed)
Medication Instructions:  Your physician recommends that you continue on your current medications as directed. Please refer to the Current Medication list given to you today.  Lab Work: NONE If you have labs (blood work) drawn today and your tests are completely normal, you will receive your results only by: [place] (if you have MyChart) OR A paper copy in the mail If you have any lab test that is abnormal or we need to change your treatment, we will call you to review the results.   Testing/Procedures: Your physician has requested that you wear a 14 day heart monitor.    Follow-Up: At [facility], you and your health needs are our priority.  As part of our continuing mission to provide you with exceptional heart care, we have created designated Provider Care Teams.  These Care Teams include your primary Cardiologist (physician) and Advanced Practice Providers (APPs -  Physician Assistants and Nurse Practitioners) who all work together to provide you with the care you need, when you need it.    Your next appointment:   4 month(s)  The format for your next appointment:   In Person  Provider:   You may see Rudean Haskell, MD or one of the following Advanced Practice Providers on your designated Care Team:   Melina Copa, PA-C Ermalinda Barrios, PA-C   Other Instructions Bryn Gulling- Long Term Monitor Instructions  Your physician has requested you wear a ZIO patch monitor for 14 days.  This is a single patch monitor. [employer] supplies one patch monitor per enrollment. Additional stickers are not available. Please do not apply patch if you will be having a Nuclear Stress Test,  Echocardiogram, Cardiac CT, MRI, or Chest Xray during the period you would be wearing the  monitor. The patch cannot be worn during these tests. You cannot remove and re-apply the  ZIO XT patch monitor.  Your ZIO patch monitor will be mailed 3 day USPS to your address on file. It may take 3-5 days  to  receive your monitor after you have been enrolled.  Once you have received your monitor, please review the enclosed instructions. Your monitor  has already been registered assigning a specific monitor serial # to you.  Billing and Patient Assistance Program Information  We have supplied [employer] with any of your insurance information on file for billing purposes. [employer] offers a sliding scale Patient Assistance Program for patients that do not have  insurance, or whose insurance does not completely cover the cost of the ZIO monitor.  You must apply for the Patient Assistance Program to qualify for this discounted rate.  To apply, please call [employer] at [phone], select option 4, select option 2, ask to apply for  Patient Assistance Program. Theodore Demark will ask your household income, and how many people  are in your household. They will quote your out-of-pocket cost based on that information.  [employer] will also be able to set up a 23-month, interest-free payment plan if needed.  Applying the monitor   Shave hair from upper left chest.  Hold abrader disc by orange tab. Rub abrader in 40 strokes over the upper left chest as  indicated in your monitor instructions.  Clean area with 4 enclosed alcohol pads. Let dry.  Apply patch as indicated in monitor instructions. Patch will be placed under collarbone on left  side of chest with arrow pointing upward.  Rub patch adhesive wings for 2 minutes. Remove white label marked "1". Remove the white  label marked "  2". Rub patch adhesive wings for 2 additional minutes.  While looking in a mirror, press and release button in center of patch. A small green light will  flash 3-4 times. This will be your only indicator that the monitor has been turned on.  Do not shower for the first 24 hours. You may shower after the first 24 hours.  Press the button if you feel a symptom. You will hear a small click. Record Date, Time and  Symptom in the Patient  Logbook.  When you are ready to remove the patch, follow instructions on the last 2 pages of Patient  Logbook. Stick patch monitor onto the last page of Patient Logbook.  Place Patient Logbook in the blue and white box. Use locking tab on box and tape box closed  securely. The blue and white box has prepaid postage on it. Please place it in the mailbox as  soon as possible. Your physician should have your test results approximately 7 days after the  monitor has been mailed back to [facility].  Call [place] at [phone] if you have questions regarding  your ZIO XT patch monitor. Call them immediately if you see an orange light blinking on your  monitor.  If your monitor falls off in less than 4 days, contact our Monitor department at [phone].  If your monitor becomes loose or falls off after 4 days call [employer] at [phone] for  suggestions on securing your monitor

## 2020-12-26 NOTE — Progress Notes (Unsigned)
Patient enrolled for [employer] to mail a 14 day ZIO XT monitor to her home. 

## 2020-12-27 ENCOUNTER — Encounter: Payer: Self-pay | Admitting: Neurology

## 2020-12-27 ENCOUNTER — Ambulatory Visit: Payer: BC Managed Care – PPO | Admitting: Neurology

## 2020-12-27 VITALS — BP 104/78 | HR 75 | Ht 62.0 in | Wt 98.5 lb

## 2020-12-27 DIAGNOSIS — M546 Pain in thoracic spine: Secondary | ICD-10-CM

## 2020-12-27 DIAGNOSIS — R251 Tremor, unspecified: Secondary | ICD-10-CM

## 2020-12-27 DIAGNOSIS — R200 Anesthesia of skin: Secondary | ICD-10-CM
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
## 2021-01-03 ENCOUNTER — Telehealth: Payer: Self-pay | Admitting: Internal Medicine

## 2021-01-03 NOTE — Telephone Encounter (Signed)
Patient called wondering if she should wait to wear the monitor that was sent to her now even though she has not had any symptoms for the past few weeks?  Or should she wait until she starts having them again?

## 2021-01-04 ENCOUNTER — Ambulatory Visit: Payer: BC Managed Care – PPO | Admitting: Obstetrics and Gynecology

## 2021-01-04 ENCOUNTER — Encounter: Payer: Self-pay | Admitting: Anesthesiology

## 2021-01-04 NOTE — Telephone Encounter (Signed)
Left a message for pt to call back
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
## 2021-01-05 NOTE — Telephone Encounter (Signed)
Pt is calling to ask Dr. Gasper Sells if it's ok to return her 14 day zio back to the company, and refer to using it as needed.  Pt states she was ordered for a 14 day zio for complaints of palpitations at last OV on 10/10. Pt states since she saw Dr. Gasper Sells on 10/10, she has been completely asymptomatic from a cardiac perspective, with no complaints of palpitations.  Pt states she has the monitor at her home but hasn't placed this on yet, for she doesn't have anymore palpitations.  Pt states she called the monitor company and they stated they would not bill her until the monitor is placed and data is obtained.  Pt is asking Dr. Gasper Sells if he's ok with her returning the monitor and refer to it as needed, if symptoms return. Informed the pt that Dr. Gasper Sells and his RN are out of the office at this time, but I will route this message to them to further review and advise, upon return to the office.  Pt verbalized understanding and agrees with this plan.

## 2021-01-05 NOTE — Telephone Encounter (Signed)
Patient is returning call.  °

## 2021-01-06 NOTE — Telephone Encounter (Signed)
Called pt and informed her of MD recommendation.  Pt expresses that she will send monitor back and if she has symptoms again she will call in for another monitor.  Pt had no further questions or concerns.

## 2021-01-18 ENCOUNTER — Other Ambulatory Visit: Payer: Self-pay | Admitting: Family Medicine

## 2021-01-18 DIAGNOSIS — Z1231 Encounter for screening mammogram for malignant neoplasm of breast: Secondary | ICD-10-CM

## 2021-02-22 ENCOUNTER — Ambulatory Visit
Admission: RE | Admit: 2021-02-22 | Discharge: 2021-02-22 | Disposition: A | Discharge status: Home / Self Care | Payer: BC Managed Care – PPO | Source: Ambulatory Visit | Attending: Family Medicine | Admitting: Family Medicine

## 2021-02-22 DIAGNOSIS — Z1231 Encounter for screening mammogram for malignant neoplasm of breast: Secondary | ICD-10-CM

## 2021-02-28 ENCOUNTER — Ambulatory Visit: Payer: BC Managed Care – PPO | Admitting: Obstetrics and Gynecology
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
## 2021-04-13 ENCOUNTER — Other Ambulatory Visit: Payer: Self-pay

## 2021-04-13 ENCOUNTER — Encounter: Payer: Self-pay | Admitting: Obstetrics and Gynecology

## 2021-04-13 ENCOUNTER — Other Ambulatory Visit (HOSPITAL_COMMUNITY)
Admission: RE | Admit: 2021-04-13 | Discharge: 2021-04-13 | Disposition: A | Discharge status: Home / Self Care | Payer: BC Managed Care – PPO | Source: Ambulatory Visit | Attending: Obstetrics and Gynecology | Admitting: Obstetrics and Gynecology

## 2021-04-13 ENCOUNTER — Ambulatory Visit (INDEPENDENT_AMBULATORY_CARE_PROVIDER_SITE_OTHER): Payer: BC Managed Care – PPO | Admitting: Obstetrics and Gynecology

## 2021-04-13 VITALS — BP 98/60 | HR 60 | Ht 61.5 in | Wt 97.0 lb

## 2021-04-13 DIAGNOSIS — Z01419 Encounter for gynecological examination (general) (routine) without abnormal findings: Secondary | ICD-10-CM

## 2021-04-13 DIAGNOSIS — Z124 Encounter for screening for malignant neoplasm of cervix: Secondary | ICD-10-CM | POA: Insufficient documentation

## 2021-04-13 NOTE — Patient Instructions (Signed)

## 2021-04-17 LAB — CYTOLOGY - PAP
Comment: NEGATIVE
Diagnosis: NEGATIVE
High risk HPV: NEGATIVE

## 2021-04-25 ENCOUNTER — Ambulatory Visit: Payer: BC Managed Care – PPO | Admitting: Obstetrics and Gynecology

## 2021-05-30 ENCOUNTER — Ambulatory Visit: Payer: BC Managed Care – PPO | Admitting: Internal Medicine

## 2021-12-08 ENCOUNTER — Other Ambulatory Visit: Payer: Self-pay | Admitting: Family Medicine

## 2021-12-08 DIAGNOSIS — Z0000 Encounter for general adult medical examination without abnormal findings: Secondary | ICD-10-CM

## 2021-12-11 ENCOUNTER — Other Ambulatory Visit (HOSPITAL_COMMUNITY): Payer: Self-pay | Admitting: Family Medicine

## 2021-12-11 DIAGNOSIS — Z0000 Encounter for general adult medical examination without abnormal findings: Secondary | ICD-10-CM

## 2021-12-22 ENCOUNTER — Other Ambulatory Visit: Payer: BC Managed Care – PPO

## 2021-12-25 ENCOUNTER — Ambulatory Visit (HOSPITAL_COMMUNITY)
Admission: RE | Admit: 2021-12-25 | Discharge: 2021-12-25 | Disposition: A | Discharge status: Home / Self Care | Payer: Self-pay | Source: Ambulatory Visit | Attending: Family Medicine | Admitting: Family Medicine

## 2021-12-25 DIAGNOSIS — Z0000 Encounter for general adult medical examination without abnormal findings: Secondary | ICD-10-CM | POA: Insufficient documentation

## 2021-12-27 ENCOUNTER — Ambulatory Visit (HOSPITAL_COMMUNITY): Payer: BC Managed Care – PPO

## 2022-01-12 ENCOUNTER — Encounter: Payer: Self-pay | Admitting: Internal Medicine

## 2022-01-12 ENCOUNTER — Ambulatory Visit: Payer: BC Managed Care – PPO | Attending: Internal Medicine | Admitting: Internal Medicine

## 2022-01-12 VITALS — BP 90/60 | HR 63 | Ht 63.0 in | Wt 95.8 lb

## 2022-01-12 DIAGNOSIS — R931 Abnormal findings on diagnostic imaging of heart and coronary circulation: Secondary | ICD-10-CM

## 2022-01-12 DIAGNOSIS — I2510 Atherosclerotic heart disease of native coronary artery without angina pectoris: Secondary | ICD-10-CM | POA: Diagnosis not present

## 2022-01-12 DIAGNOSIS — E782 Mixed hyperlipidemia: Secondary | ICD-10-CM

## 2022-01-12 DIAGNOSIS — I700 Atherosclerosis of aorta: Secondary | ICD-10-CM

## 2022-01-12 DIAGNOSIS — I2584 Coronary atherosclerosis due to calcified coronary lesion: Secondary | ICD-10-CM

## 2022-01-12 MED ORDER — ROSUVASTATIN CALCIUM 5 MG PO TABS: 5.0000 mg | ORAL_TABLET | Freq: Every day | ORAL | 3 refills | Status: DC
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
## 2022-01-12 NOTE — Patient Instructions (Signed)
Medication Instructions:  Your physician has recommended you make the following change in your medication:  START: rosuvastatin (Crestor) 5 mg by mouth once daily at dinner time  *If you need a refill on your cardiac medications before your next appointment, please call your pharmacy*   Lab Work: IN 3 MONTHS: FLP, ALT, Lipoprotein A (nothing to eat or drink 12 hours prior except water and black coffee)   If you have labs (blood work) drawn today and your tests are completely normal, you will receive your results only by: [place] (if you have MyChart) OR A paper copy in the mail If you have any lab test that is abnormal or we need to change your treatment, we will call you to review the results.   Testing/Procedures: NONE   Follow-Up: At [facility], you and your health needs are our priority.  As part of our continuing mission to provide you with exceptional heart care, we have created designated Provider Care Teams.  These Care Teams include your primary Cardiologist (physician) and Advanced Practice Providers (APPs -  Physician Assistants and Nurse Practitioners) who all work together to provide you with the care you need, when you need it.    Your next appointment:   1 year(s)  The format for your next appointment:   In Person  Provider:   Werner Lean, MD      Important Information About Sugar

## 2022-02-01 ENCOUNTER — Other Ambulatory Visit: Payer: Self-pay | Admitting: Family Medicine

## 2022-02-01 ENCOUNTER — Ambulatory Visit: Payer: BC Managed Care – PPO

## 2022-02-01 ENCOUNTER — Ambulatory Visit: Payer: Self-pay

## 2022-02-01 DIAGNOSIS — R52 Pain, unspecified: Secondary | ICD-10-CM

## 2022-04-13 ENCOUNTER — Other Ambulatory Visit: Payer: BC Managed Care – PPO
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
## 2022-04-26 ENCOUNTER — Ambulatory Visit: Payer: BC Managed Care – PPO | Attending: Internal Medicine

## 2022-04-26 DIAGNOSIS — R931 Abnormal findings on diagnostic imaging of heart and coronary circulation: Secondary | ICD-10-CM

## 2022-04-26 LAB — LIPOPROTEIN A (LPA)

## 2022-04-28 LAB — LIPID PANEL
Chol/HDL Ratio: 2 ratio (ref 0.0–4.4)
Cholesterol, Total: 174 mg/dL (ref 100–199)
HDL: 89 mg/dL (ref >39)
LDL Chol Calc (NIH): 73 mg/dL (ref 0–99)
Triglycerides: 61 mg/dL (ref 0–149)
VLDL Cholesterol Cal: 12 mg/dL (ref 5–40)

## 2022-04-28 LAB — ALT: ALT: 17 IU/L (ref 0–32)

## 2022-05-02 ENCOUNTER — Telehealth: Payer: Self-pay | Admitting: Internal Medicine

## 2022-05-02 NOTE — Telephone Encounter (Signed)
Pt c/o medication issue:  1. Name of Medication:  rosuvastatin (CRESTOR) 5 MG tablet  2. How are you currently taking this medication (dosage and times per day)?   3. Are you having a reaction (difficulty breathing--STAT)?   4. What is your medication issue?   Patient states she finished Rosuvastatin about a week ago. Since her results were normal, she would like to know if it will be necessary for her to continue taking it. Please advise.

## 2022-05-03 NOTE — Telephone Encounter (Signed)
Patient is returning call to get update on medication. Requesting return call.

## 2022-05-03 NOTE — Telephone Encounter (Signed)
Patient is returning call to ask more questions from previous conversation.

## 2022-05-03 NOTE — Telephone Encounter (Signed)
Patient called again,reiterated what RN explained. Pt wanted to know how long to take the medications and when should she come back for f/u.

## 2022-05-03 NOTE — Telephone Encounter (Signed)
Pt. Stated she wanted clarification if she should still take her medications even though her labs results are in normal range. Will forward to MD and nurse.

## 2022-05-03 NOTE — Telephone Encounter (Signed)
Called pt in regards to rosuvastatin.  Advised pt should continue medication to keep LDL below 70.  Pt reports was told by MD that this medication will not be life long only needs to take until numbers look better. Explained to pt that has a goal of 70< currently at 68 with medication.  If stops medication could possibly go higher.  Pt has a hx of coronary calcification and aortic atherosclerosis. Pt wants MD to review her case and determine if needs to continue rosuvastatin. Will send to MD to advise.

## 2022-05-04 NOTE — Telephone Encounter (Signed)
Called pt advised MD would like to continue medication.  Pt is agreeable reports picked up refill from pharmacy yesterday.  Asked if needs to have labs drawn again in 3 months.  Advised pt at Oct 2024 OV to fast prior and MD will check at that time.  Pt had no further questions or concerns.

## 2022-05-04 NOTE — Telephone Encounter (Signed)
Left a message to call back

## 2022-05-04 NOTE — Telephone Encounter (Signed)
Patient returning call.

## 2022-05-07 ENCOUNTER — Ambulatory Visit: Payer: BC Managed Care – PPO | Admitting: Obstetrics and Gynecology

## 2022-06-07 ENCOUNTER — Encounter (INDEPENDENT_AMBULATORY_CARE_PROVIDER_SITE_OTHER): Payer: BC Managed Care – PPO | Admitting: Ophthalmology

## 2022-06-11 ENCOUNTER — Encounter (INDEPENDENT_AMBULATORY_CARE_PROVIDER_SITE_OTHER): Payer: BC Managed Care – PPO | Admitting: Ophthalmology

## 2022-06-11 DIAGNOSIS — H43813 Vitreous degeneration, bilateral: Secondary | ICD-10-CM

## 2022-06-11 DIAGNOSIS — H4421 Degenerative myopia, right eye: Secondary | ICD-10-CM | POA: Diagnosis not present

## 2022-06-11 DIAGNOSIS — H33301 Unspecified retinal break, right eye: Secondary | ICD-10-CM | POA: Diagnosis not present

## 2022-06-11 DIAGNOSIS — H2513 Age-related nuclear cataract, bilateral: Secondary | ICD-10-CM

## 2022-06-21 ENCOUNTER — Encounter (INDEPENDENT_AMBULATORY_CARE_PROVIDER_SITE_OTHER): Payer: BC Managed Care – PPO | Admitting: Ophthalmology

## 2022-06-21 DIAGNOSIS — H33301 Unspecified retinal break, right eye: Secondary | ICD-10-CM

## 2022-10-01 ENCOUNTER — Encounter (INDEPENDENT_AMBULATORY_CARE_PROVIDER_SITE_OTHER): Payer: BC Managed Care – PPO | Admitting: Ophthalmology

## 2022-10-08 ENCOUNTER — Encounter (INDEPENDENT_AMBULATORY_CARE_PROVIDER_SITE_OTHER): Payer: BC Managed Care – PPO | Admitting: Ophthalmology

## 2022-10-08 DIAGNOSIS — H2513 Age-related nuclear cataract, bilateral: Secondary | ICD-10-CM

## 2022-10-08 DIAGNOSIS — H33301 Unspecified retinal break, right eye: Secondary | ICD-10-CM

## 2022-10-08 DIAGNOSIS — H43813 Vitreous degeneration, bilateral: Secondary | ICD-10-CM

## 2022-10-31 ENCOUNTER — Other Ambulatory Visit: Payer: Self-pay | Admitting: Family Medicine

## 2022-10-31 DIAGNOSIS — Z1231 Encounter for screening mammogram for malignant neoplasm of breast: Secondary | ICD-10-CM

## 2022-11-14 ENCOUNTER — Ambulatory Visit: Payer: BC Managed Care – PPO

## 2022-11-14 ENCOUNTER — Ambulatory Visit: Admission: RE | Admit: 2022-11-14 | Payer: BC Managed Care – PPO | Source: Ambulatory Visit

## 2022-11-14 DIAGNOSIS — Z1231 Encounter for screening mammogram for malignant neoplasm of breast: Secondary | ICD-10-CM

## 2022-12-06 ENCOUNTER — Telehealth: Payer: Self-pay | Admitting: Internal Medicine

## 2022-12-06 DIAGNOSIS — R931 Abnormal findings on diagnostic imaging of heart and coronary circulation: Secondary | ICD-10-CM

## 2022-12-06 DIAGNOSIS — E782 Mixed hyperlipidemia: Secondary | ICD-10-CM

## 2022-12-06 NOTE — Telephone Encounter (Signed)
Pt c/o medication issue:  1. Name of Medication: rosuvastatin (CRESTOR) 5 MG tablet   2. How are you currently taking this medication (dosage and times per day)?   3. Are you having a reaction (difficulty breathing--STAT)?   4. What is your medication issue? Patient is requesting call back to discuss if she needs to have labs done before appt since her appt date is pushed out, or if she needs to wait until closer to time for appt. Please advise.

## 2022-12-06 NOTE — Telephone Encounter (Signed)
Called pt to address concern.  Pt reports was told by MD that she could be on rosuvastatin for 6 m and if numbers come down can stop medication.  Advised pt if stop medication then numbers could potentially go back up.  Pt reports will discuss with MD.  Would like to have f/u labs prior to 04/05/23 OV with MD.  Pt will come in for labs on 04/02/22.

## 2023-02-18 ENCOUNTER — Telehealth: Payer: Self-pay | Admitting: Internal Medicine

## 2023-02-18 MED ORDER — ROSUVASTATIN CALCIUM 5 MG PO TABS: 5.0000 mg | ORAL_TABLET | Freq: Every day | ORAL | 0 refills | Status: DC

## 2023-02-18 NOTE — Telephone Encounter (Signed)
*  STAT* If patient is at the pharmacy, call can be transferred to refill team.   1. Which medications need to be refilled? (please list name of each medication and dose if known) rosuvastatin (CRESTOR) 5 MG tablet   2. Which pharmacy/location (including street and city if local pharmacy) is medication to be sent to?  [facility] [id number] - [place], [place] - [address]      3. Do they need a 30 day or 90 day supply? 90 day   Pt is out of medication

## 2023-02-18 NOTE — Telephone Encounter (Signed)
Spoke with pt. Pt was notified that Crestor 5 mg every day, was sent to her pharmacy. Pt needs to keep apt for additional refills.

## 2023-04-03 ENCOUNTER — Ambulatory Visit: Payer: 59 | Attending: Internal Medicine

## 2023-04-03 DIAGNOSIS — E782 Mixed hyperlipidemia: Secondary | ICD-10-CM

## 2023-04-03 DIAGNOSIS — R931 Abnormal findings on diagnostic imaging of heart and coronary circulation: Secondary | ICD-10-CM

## 2023-04-03 LAB — ALT: ALT: 21 [IU]/L (ref 0–32)

## 2023-04-03 LAB — LIPID PANEL
Chol/HDL Ratio: 2.1 {ratio} (ref 0.0–4.4)
Cholesterol, Total: 212 mg/dL — ABNORMAL HIGH (ref 100–199)
HDL: 101 mg/dL (ref >39)
LDL Chol Calc (NIH): 102 mg/dL — ABNORMAL HIGH (ref 0–99)
Triglycerides: 52 mg/dL (ref 0–149)
VLDL Cholesterol Cal: 9 mg/dL (ref 5–40)

## 2023-04-05 ENCOUNTER — Ambulatory Visit: Payer: 59 | Attending: Internal Medicine | Admitting: Internal Medicine

## 2023-04-05 VITALS — BP 110/68 | HR 64 | Ht 63.0 in | Wt 96.6 lb

## 2023-04-05 DIAGNOSIS — I700 Atherosclerosis of aorta: Secondary | ICD-10-CM

## 2023-04-05 DIAGNOSIS — E782 Mixed hyperlipidemia: Secondary | ICD-10-CM | POA: Diagnosis not present

## 2023-04-05 DIAGNOSIS — R931 Abnormal findings on diagnostic imaging of heart and coronary circulation: Secondary | ICD-10-CM | POA: Diagnosis not present

## 2023-04-05 DIAGNOSIS — I2510 Atherosclerotic heart disease of native coronary artery without angina pectoris: Secondary | ICD-10-CM | POA: Diagnosis not present
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
## 2023-04-05 NOTE — Patient Instructions (Signed)
Medication Instructions:  Your physician recommends that you continue on your current medications as directed. Please refer to the Current Medication list given to you today.  *If you need a refill on your cardiac medications before your next appointment, please call your pharmacy*   Lab Work: Lpa, FLP, ALT (nothing to eat or drink except water 12 hours prior)   If you have labs (blood work) drawn today and your tests are completely normal, you will receive your results only by: MyChart Message (if you have MyChart) OR A paper copy in the mail If you have any lab test that is abnormal or we need to change your treatment, we will call you to review the results.   Testing/Procedures: NONE   Follow-Up: At [facility], you and your health needs are our priority.  As part of our continuing mission to provide you with exceptional heart care, we have created designated Provider Care Teams.  These Care Teams include your primary Cardiologist (physician) and Advanced Practice Providers (APPs -  Physician Assistants and Nurse Practitioners) who all work together to provide you with the care you need, when you need it.   Your next appointment:   1 year(s)  Provider:   Christell Constant, MD

## 2023-07-15 ENCOUNTER — Institutional Professional Consult (permissible substitution) (INDEPENDENT_AMBULATORY_CARE_PROVIDER_SITE_OTHER): Admitting: Otolaryngology

## 2023-07-16 ENCOUNTER — Ambulatory Visit (INDEPENDENT_AMBULATORY_CARE_PROVIDER_SITE_OTHER): Admitting: Otolaryngology

## 2023-07-16 VITALS — BP 124/80 | HR 64 | Wt 92.0 lb

## 2023-07-16 DIAGNOSIS — R0982 Postnasal drip: Secondary | ICD-10-CM

## 2023-07-16 DIAGNOSIS — R490 Dysphonia: Secondary | ICD-10-CM | POA: Diagnosis not present

## 2023-07-16 DIAGNOSIS — J383 Other diseases of vocal cords: Secondary | ICD-10-CM | POA: Diagnosis not present

## 2023-07-16 DIAGNOSIS — J3089 Other allergic rhinitis: Secondary | ICD-10-CM

## 2023-07-16 DIAGNOSIS — K219 Gastro-esophageal reflux disease without esophagitis: Secondary | ICD-10-CM

## 2023-07-16 MED ORDER — CETIRIZINE HCL 10 MG PO TABS: 10.0000 mg | ORAL_TABLET | Freq: Every day | ORAL | 11 refills | Status: AC

## 2023-07-16 MED ORDER — FLUTICASONE PROPIONATE 50 MCG/ACT NA SUSP: 2.0000 | Freq: Every day | NASAL | 6 refills | Status: AC

## 2023-07-16 NOTE — Patient Instructions (Signed)
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
## 2023-07-31 ENCOUNTER — Ambulatory Visit

## 2023-08-13 ENCOUNTER — Ambulatory Visit: Attending: Otolaryngology
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
## 2023-11-02 LAB — LIPID PANEL
Chol/HDL Ratio: 2.1 ratio (ref 0.0–4.4)
Cholesterol, Total: 182 mg/dL (ref 100–199)
HDL: 86 mg/dL (ref >39)
LDL Chol Calc (NIH): 85 mg/dL (ref 0–99)
Triglycerides: 57 mg/dL (ref 0–149)
VLDL Cholesterol Cal: 11 mg/dL (ref 5–40)

## 2023-11-02 LAB — LIPOPROTEIN A (LPA): Lipoprotein (a): 40.4 nmol/L (ref <75.0)

## 2023-11-02 LAB — ALT: ALT: 15 IU/L (ref 0–32)

## 2023-11-04 ENCOUNTER — Ambulatory Visit: Payer: Self-pay

## 2023-11-04 DIAGNOSIS — E782 Mixed hyperlipidemia: Secondary | ICD-10-CM

## 2023-11-04 DIAGNOSIS — R931 Abnormal findings on diagnostic imaging of heart and coronary circulation: Secondary | ICD-10-CM

## 2023-11-04 DIAGNOSIS — I700 Atherosclerosis of aorta: Secondary | ICD-10-CM

## 2023-11-12 MED ORDER — ROSUVASTATIN CALCIUM 10 MG PO TABS: 10.0000 mg | ORAL_TABLET | Freq: Every day | ORAL | 3 refills | Status: AC

## 2023-11-12 NOTE — Telephone Encounter (Signed)
 The patient has been notified of the result and verbalized understanding.  All questions (if any) were answered. Hamp LOISE Norrie, RN 11/12/2023 6:21 PM   Lab orders placed and released for future draw.

## 2023-11-12 NOTE — Telephone Encounter (Signed)
 Pt called back, she wants to make sure that 70 is the recommended number because she read that her levels are normal. Please advise. She states she prefers to speak on the phone, she asked if you could c/b in an hour

## 2023-12-13 ENCOUNTER — Telehealth: Payer: Self-pay | Admitting: Internal Medicine

## 2023-12-13 NOTE — Telephone Encounter (Signed)
 Called pt reports will upload denial letter for labs to My chart for review.  Advised pt once uploaded will send to Billing to follow up.  Pt had no further concerns at this time.

## 2023-12-13 NOTE — Telephone Encounter (Signed)
 Pt is requesting a callback regarding once of her lab Lipoprotein A (LPA) not being covered by her insurance due to a code being incorrect or entered differently when it was once covered before. She'd like to discuss further with nurse. Please advise

## 2023-12-25 NOTE — Telephone Encounter (Signed)
 Called pt to f/u billing concern I have not received my chart message with denial.  Pt reports issue is not resolved.  I will send to billing to f/u.

## 2024-01-24 ENCOUNTER — Other Ambulatory Visit: Payer: Self-pay | Admitting: Family Medicine

## 2024-01-24 DIAGNOSIS — Z1231 Encounter for screening mammogram for malignant neoplasm of breast: Secondary | ICD-10-CM

## 2024-02-17 ENCOUNTER — Telehealth: Payer: Self-pay | Admitting: Internal Medicine

## 2024-02-17 NOTE — Telephone Encounter (Signed)
 I was able to call the patient and let her know that she can go to any [facility] location to get get blood work done. She had questions on which blood work we were ordering and medication management. Her questions were answered and the patient understood that this was for her 3 month medication evaluation from increasing Crestor  from 5 mg to 10 mg. She said she can get this done some time during the week. I will let her provider know.

## 2024-02-17 NOTE — Telephone Encounter (Signed)
 Patient is returning call.

## 2024-02-17 NOTE — Telephone Encounter (Signed)
 Pt called in and was unsure when she was suppose to come back to do her lab work.  She would like a call from a nurse   Best number  [phone]

## 2024-02-17 NOTE — Telephone Encounter (Signed)
 Left message that fasting labs are due now/as soon as able -- lipid panel, ALT

## 2024-02-18 ENCOUNTER — Ambulatory Visit
Admission: RE | Admit: 2024-02-18 | Discharge: 2024-02-18 | Disposition: A | Source: Ambulatory Visit | Attending: Family Medicine | Admitting: Family Medicine

## 2024-02-18 DIAGNOSIS — Z1231 Encounter for screening mammogram for malignant neoplasm of breast: Secondary | ICD-10-CM

## 2024-02-21 LAB — LIPID PANEL
Chol/HDL Ratio: 2 ratio (ref 0.0–4.4)
Cholesterol, Total: 168 mg/dL (ref 100–199)
HDL: 86 mg/dL (ref >39)
LDL Chol Calc (NIH): 71 mg/dL (ref 0–99)
Triglycerides: 55 mg/dL (ref 0–149)
VLDL Cholesterol Cal: 11 mg/dL (ref 5–40)

## 2024-02-21 LAB — ALT: ALT: 15 IU/L (ref 0–32)

## 2024-05-04 ENCOUNTER — Ambulatory Visit: Admitting: Internal Medicine

## 2024-06-02 ENCOUNTER — Ambulatory Visit: Admitting: Internal Medicine
